# Patient Record
Sex: Male | Born: 1995 | Race: Black or African American | Hispanic: No | Marital: Single | Smoking: Current every day smoker
Health system: Southern US, Community
[De-identification: ages and names within clinical notes are randomized; demographics above are authoritative.]

## PROBLEM LIST (undated history)

## (undated) DIAGNOSIS — W3400XA Accidental discharge from unspecified firearms or gun, initial encounter: Secondary | ICD-10-CM

## (undated) HISTORY — PX: KNEE SURGERY: SHX244

---
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
## 2014-07-31 ENCOUNTER — Ambulatory Visit: Payer: Self-pay | Admitting: Orthopedic Surgery

## 2014-09-09 NOTE — Op Note (Signed)
PATIENT NAME:  Juan Donovan, Juan Donovan MR#:  [id number] DATE OF BIRTH:  28-Aug-1995  DATE OF PROCEDURE:  07/31/2014  PREOPERATIVE DIAGNOSIS: Right knee loose body.   POSTOPERATIVE DIAGNOSIS: Right knee loose body.     PROCEDURE: Removal of loose body, right knee.   ANESTHESIA: General.   SURGEON:  Leitha SchullerMichael J. Aleyna Cueva, MD  DESCRIPTION OF PROCEDURE: The patient was brought to the operating room and after adequate anesthesia was obtained, the right leg was prepped and draped in the usual sterile fashion with tourniquet applied to the upper thigh.  After patient identification and timeout procedures were completed, an inferolateral portal was made and the arthroscope was introduced and its initial inspection revealed normal-appearing patellofemoral joint coming around medially.   An inferomedial portal was made and an inspection of the medial meniscus was intact as was the articular cartilage.  ACL intact with some laxity to the anterior medial fibers.   There was some chondromalacia on the lateral femoral condyle. The lateral  meniscus was intact. The loose body could not be visualized on initial arthroscopy, and it was a very difficult loose body to remove.  It was in the lateral gutter and went posterior and it could not be brought back out. Attempts were made to bring it out using an additional portal for transpatellar tendon and another anterolateral portal lateral to the initial.   Finally with fluoroscopy being used to help get visualization, a posterolateral portal was made and the loose body was removed.   The tourniquet was raised during the case and was up for 120 minutes to try to minimize bleeding and get better visualization, but, again, it was very difficult to get the loose body out of the posterior space and ultimately had to have posterolateral incision. Tourniquet was let down prior to the close of the case and there was no significant bleeding. The wounds were closed with a 2-0 Vicryl subcutaneously  and 4-0 nylon for the skin. Thirty mL of 0.5% Sensorcaine infiltrated for postop analgesia. Sterile dressing of Xeroform, 4 x 4's, Webril and Ace wrap applied and the patient was sent to recovery in stable condition.   ESTIMATED BLOOD LOSS: Minimal.   COMPLICATIONS: None.   SPECIMEN: Removed loose body.     ____________________________ Leitha SchullerMichael J. Alexanderia Gorby, MD mjm:tr D: 07/31/2014 17:14:41 ET T: 07/31/2014 17:54:34 ET JOB#: [id number]  cc: Leitha SchullerMichael J. Edman Lipsey, MD, <Dictator> Leitha SchullerMICHAEL J Katrinna Travieso MD ELECTRONICALLY SIGNED 08/01/2014 3:21

## 2015-08-21 ENCOUNTER — Encounter: Payer: Self-pay | Admitting: Emergency Medicine

## 2015-08-21 ENCOUNTER — Emergency Department: Payer: Medicaid Other

## 2015-08-21 ENCOUNTER — Emergency Department
Admission: EM | Admit: 2015-08-21 | Discharge: 2015-08-21 | Disposition: A | Discharge status: Home / Self Care | Payer: Medicaid Other | Attending: Emergency Medicine | Admitting: Emergency Medicine

## 2015-08-21 DIAGNOSIS — Y9301 Activity, walking, marching and hiking: Secondary | ICD-10-CM | POA: Diagnosis not present

## 2015-08-21 DIAGNOSIS — S92131B Displaced fracture of posterior process of right talus, initial encounter for open fracture: Secondary | ICD-10-CM | POA: Insufficient documentation

## 2015-08-21 DIAGNOSIS — W3400XA Accidental discharge from unspecified firearms or gun, initial encounter: Secondary | ICD-10-CM | POA: Diagnosis not present

## 2015-08-21 DIAGNOSIS — M25571 Pain in right ankle and joints of right foot: Secondary | ICD-10-CM | POA: Diagnosis present

## 2015-08-21 DIAGNOSIS — F17200 Nicotine dependence, unspecified, uncomplicated: Secondary | ICD-10-CM | POA: Insufficient documentation

## 2015-08-21 DIAGNOSIS — Y999 Unspecified external cause status: Secondary | ICD-10-CM | POA: Insufficient documentation

## 2015-08-21 DIAGNOSIS — Y929 Unspecified place or not applicable: Secondary | ICD-10-CM | POA: Insufficient documentation

## 2015-08-21 DIAGNOSIS — S92101B Unspecified fracture of right talus, initial encounter for open fracture: Secondary | ICD-10-CM

## 2015-08-21 LAB — COMPREHENSIVE METABOLIC PANEL
ALK PHOS: 51 U/L (ref 38–126)
ALT: 13 U/L — AB (ref 17–63)
AST: 32 U/L (ref 15–41)
Albumin: 5.1 g/dL — ABNORMAL HIGH (ref 3.5–5.0)
Anion gap: 24 — ABNORMAL HIGH (ref 5–15)
BUN: 12 mg/dL (ref 6–20)
CHLORIDE: 103 mmol/L (ref 101–111)
CO2: 13 mmol/L — AB (ref 22–32)
Calcium: 9.4 mg/dL (ref 8.9–10.3)
Creatinine, Ser: 1.2 mg/dL (ref 0.61–1.24)
Glucose, Bld: 69 mg/dL (ref 65–99)
Potassium: 2.8 mmol/L — CL (ref 3.5–5.1)
SODIUM: 140 mmol/L (ref 135–145)
Total Bilirubin: 1.1 mg/dL (ref 0.3–1.2)
Total Protein: 8.1 g/dL (ref 6.5–8.1)

## 2015-08-21 LAB — CBC
HEMATOCRIT: 45.9 % (ref 40.0–52.0)
HEMOGLOBIN: 15.4 g/dL (ref 13.0–18.0)
MCH: 29.6 pg (ref 26.0–34.0)
MCHC: 33.7 g/dL (ref 32.0–36.0)
MCV: 87.9 fL (ref 80.0–100.0)
Platelets: 206 10*3/uL (ref 150–440)
RBC: 5.22 MIL/uL (ref 4.40–5.90)
RDW: 14.4 % (ref 11.5–14.5)
WBC: 10.9 10*3/uL — AB (ref 3.8–10.6)

## 2015-08-21 MED ORDER — CEPHALEXIN 500 MG PO CAPS: 500.0000 mg | ORAL_CAPSULE | Freq: Two times a day (BID) | ORAL | Status: AC

## 2015-08-21 MED ORDER — MORPHINE SULFATE (PF) 2 MG/ML IV SOLN
INTRAVENOUS | Status: AC
  Administered 2015-08-21: 4 mg via INTRAVENOUS
  Filled 2015-08-21: qty 2

## 2015-08-21 MED ORDER — POTASSIUM CHLORIDE CRYS ER 20 MEQ PO TBCR
40.0000 meq | EXTENDED_RELEASE_TABLET | Freq: Two times a day (BID) | ORAL | Status: DC
  Administered 2015-08-21: 40 meq via ORAL
  Filled 2015-08-21: qty 2

## 2015-08-21 MED ORDER — ONDANSETRON HCL 4 MG/2ML IJ SOLN
INTRAMUSCULAR | Status: AC
  Administered 2015-08-21: 4 mg via INTRAVENOUS
  Filled 2015-08-21: qty 2

## 2015-08-21 MED ORDER — IOPAMIDOL (ISOVUE-370) INJECTION 76%
125.0000 mL | Freq: Once | INTRAVENOUS | Status: AC | PRN
  Administered 2015-08-21: 125 mL via INTRAVENOUS

## 2015-08-21 MED ORDER — OXYCODONE-ACETAMINOPHEN 5-325 MG PO TABS: 1.0000 | ORAL_TABLET | ORAL | Status: DC | PRN

## 2015-08-21 MED ORDER — ONDANSETRON HCL 4 MG/2ML IJ SOLN
4.0000 mg | Freq: Once | INTRAMUSCULAR | Status: AC
  Administered 2015-08-21: 4 mg via INTRAVENOUS

## 2015-08-21 MED ORDER — BACITRACIN ZINC 500 UNIT/GM EX OINT
TOPICAL_OINTMENT | CUTANEOUS | Status: AC
  Filled 2015-08-21: qty 0.9

## 2015-08-21 MED ORDER — MORPHINE SULFATE (PF) 4 MG/ML IV SOLN: 4.0000 mg | Freq: Once | INTRAVENOUS | Status: DC
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
## 2015-08-21 NOTE — ED Notes (Signed)
Patient transported to CT 
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
## 2015-08-21 NOTE — ED Notes (Signed)
Pyxis was out of 4mg  Morphine tubes. Administered 2 (2mg ) doses. When charted, it scanned by the cabinet pull and would not allow to chart as duplicate.

## 2016-07-25 ENCOUNTER — Encounter: Payer: Self-pay | Admitting: Emergency Medicine

## 2016-07-25 DIAGNOSIS — Y929 Unspecified place or not applicable: Secondary | ICD-10-CM | POA: Insufficient documentation

## 2016-07-25 DIAGNOSIS — F17210 Nicotine dependence, cigarettes, uncomplicated: Secondary | ICD-10-CM | POA: Insufficient documentation

## 2016-07-25 DIAGNOSIS — Y999 Unspecified external cause status: Secondary | ICD-10-CM | POA: Insufficient documentation

## 2016-07-25 DIAGNOSIS — Y9389 Activity, other specified: Secondary | ICD-10-CM | POA: Insufficient documentation

## 2016-07-25 DIAGNOSIS — S0181XA Laceration without foreign body of other part of head, initial encounter: Secondary | ICD-10-CM | POA: Insufficient documentation
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
## 2016-07-26 ENCOUNTER — Emergency Department
Admission: EM | Admit: 2016-07-26 | Discharge: 2016-07-26 | Disposition: A | Discharge status: Home / Self Care | Payer: Medicaid Other | Attending: Student in an Organized Health Care Education/Training Program | Admitting: Student in an Organized Health Care Education/Training Program

## 2016-07-26 DIAGNOSIS — T07XXXA Unspecified multiple injuries, initial encounter: Secondary | ICD-10-CM

## 2016-07-26 HISTORY — DX: Accidental discharge from unspecified firearms or gun, initial encounter: W34.00XA

## 2016-07-26 MED ORDER — NAPROXEN 500 MG PO TABS: 500.0000 mg | ORAL_TABLET | Freq: Two times a day (BID) | ORAL | 0 refills | Status: AC

## 2016-07-26 MED ORDER — BUPIVACAINE HCL 0.5 % IJ SOLN
10.0000 mL | Freq: Once | INTRAMUSCULAR | Status: AC
  Administered 2016-07-26: 10 mL

## 2016-07-26 MED ORDER — TRAMADOL HCL 50 MG PO TABS
50.0000 mg | ORAL_TABLET | Freq: Four times a day (QID) | ORAL | 0 refills | Status: AC | PRN
Start: 2016-07-26 — End: 2017-07-26

## 2016-07-26 MED ORDER — LIDOCAINE-EPINEPHRINE-TETRACAINE (LET) SOLUTION
6.0000 mL | Freq: Once | NASAL | Status: AC
  Administered 2016-07-26: 6 mL via TOPICAL

## 2016-07-26 MED ORDER — LIDOCAINE-EPINEPHRINE-TETRACAINE (LET) SOLUTION
NASAL | Status: AC
  Administered 2016-07-26: 6 mL via TOPICAL
  Filled 2016-07-26: qty 6

## 2016-07-26 NOTE — ED Notes (Signed)
Pt's facial wounds irrigated with sterile saline and betadine, LET applied.

## 2016-07-26 NOTE — Discharge Instructions (Signed)
Tylenol or Motrin as needed for any discomfort. Take any/all prescribed medications as directed. Keep the cut clean & dry for the next 24 hours. After that you may wash it gently once a day with warm, soapy water only. NO peroxide or alcohol!! You may apply some antibiotic ointment & a Band-Aid afterwards. ° °
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
## 2016-07-26 NOTE — ED Notes (Signed)
Dr. Roxan Hockeyobinson at bed side to perform lac repair
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
## 2016-10-08 ENCOUNTER — Other Ambulatory Visit
Admission: AD | Admit: 2016-10-08 | Discharge: 2016-10-08 | Disposition: A | Discharge status: Home / Self Care | Attending: Family Medicine | Admitting: Family Medicine
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
## 2017-07-18 ENCOUNTER — Emergency Department
Admission: EM | Admit: 2017-07-18 | Discharge: 2017-07-18 | Disposition: A | Discharge status: Home / Self Care | Attending: Emergency Medicine | Admitting: Emergency Medicine

## 2017-07-18 ENCOUNTER — Other Ambulatory Visit: Payer: Self-pay

## 2017-07-18 DIAGNOSIS — Y999 Unspecified external cause status: Secondary | ICD-10-CM | POA: Insufficient documentation

## 2017-07-18 DIAGNOSIS — Y929 Unspecified place or not applicable: Secondary | ICD-10-CM | POA: Insufficient documentation

## 2017-07-18 DIAGNOSIS — Z5321 Procedure and treatment not carried out due to patient leaving prior to being seen by health care provider: Secondary | ICD-10-CM | POA: Insufficient documentation

## 2017-07-18 DIAGNOSIS — Y939 Activity, unspecified: Secondary | ICD-10-CM | POA: Insufficient documentation

## 2017-07-18 DIAGNOSIS — S0990XA Unspecified injury of head, initial encounter: Secondary | ICD-10-CM | POA: Insufficient documentation
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
## 2017-08-12 ENCOUNTER — Other Ambulatory Visit: Payer: Self-pay

## 2017-08-12 ENCOUNTER — Emergency Department
Admission: EM | Admit: 2017-08-12 | Discharge: 2017-08-12 | Disposition: A | Discharge status: Home / Self Care | Payer: No Typology Code available for payment source | Attending: Emergency Medicine | Admitting: Emergency Medicine

## 2017-08-12 ENCOUNTER — Emergency Department: Payer: No Typology Code available for payment source

## 2017-08-12 ENCOUNTER — Encounter: Payer: Self-pay | Admitting: Emergency Medicine

## 2017-08-12 DIAGNOSIS — Y939 Activity, unspecified: Secondary | ICD-10-CM | POA: Insufficient documentation

## 2017-08-12 DIAGNOSIS — F17210 Nicotine dependence, cigarettes, uncomplicated: Secondary | ICD-10-CM | POA: Insufficient documentation

## 2017-08-12 DIAGNOSIS — M25561 Pain in right knee: Secondary | ICD-10-CM | POA: Diagnosis not present

## 2017-08-12 DIAGNOSIS — Y999 Unspecified external cause status: Secondary | ICD-10-CM | POA: Insufficient documentation

## 2017-08-12 MED ORDER — CYCLOBENZAPRINE HCL 10 MG PO TABS: 10.0000 mg | ORAL_TABLET | Freq: Three times a day (TID) | ORAL | 0 refills | Status: DC | PRN

## 2017-08-12 MED ORDER — NAPROXEN 500 MG PO TABS: 500.0000 mg | ORAL_TABLET | Freq: Two times a day (BID) | ORAL | 0 refills | Status: AC
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
## 2017-08-12 NOTE — Discharge Instructions (Signed)
Follow-up with the primary care provider for symptoms that are not improving over the week. Return to the emergency department for symptoms of change or worsen if you are unable to schedule an appointment.
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
## 2017-11-12 ENCOUNTER — Telehealth: Payer: Self-pay | Admitting: Emergency Medicine

## 2017-11-12 ENCOUNTER — Other Ambulatory Visit
Admission: RE | Admit: 2017-11-12 | Discharge: 2017-11-12 | Disposition: A | Discharge status: Home / Self Care | Attending: Family Medicine | Admitting: Family Medicine
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
## 2018-02-26 ENCOUNTER — Emergency Department (HOSPITAL_COMMUNITY): Payer: Medicaid Other

## 2018-02-26 ENCOUNTER — Encounter (HOSPITAL_COMMUNITY)
Admission: EM | Disposition: A | Discharge status: Home / Self Care | Payer: Self-pay | Source: Home / Self Care | Attending: Emergency Medicine

## 2018-02-26 ENCOUNTER — Other Ambulatory Visit: Payer: Self-pay

## 2018-02-26 ENCOUNTER — Observation Stay (HOSPITAL_COMMUNITY)
Admission: EM | Admit: 2018-02-26 | Discharge: 2018-02-27 | Disposition: A | Discharge status: Home / Self Care | Payer: Medicaid Other | Attending: Orthopaedic Surgery | Admitting: Orthopaedic Surgery

## 2018-02-26 ENCOUNTER — Emergency Department (HOSPITAL_COMMUNITY): Payer: Medicaid Other | Admitting: Certified Registered"

## 2018-02-26 ENCOUNTER — Encounter (HOSPITAL_COMMUNITY): Payer: Self-pay

## 2018-02-26 DIAGNOSIS — Z23 Encounter for immunization: Secondary | ICD-10-CM | POA: Insufficient documentation

## 2018-02-26 DIAGNOSIS — S66321A Laceration of extensor muscle, fascia and tendon of left index finger at wrist and hand level, initial encounter: Secondary | ICD-10-CM | POA: Insufficient documentation

## 2018-02-26 DIAGNOSIS — Y249XXA Unspecified firearm discharge, undetermined intent, initial encounter: Secondary | ICD-10-CM

## 2018-02-26 DIAGNOSIS — W3400XA Accidental discharge from unspecified firearms or gun, initial encounter: Secondary | ICD-10-CM | POA: Diagnosis not present

## 2018-02-26 DIAGNOSIS — T79A12A Traumatic compartment syndrome of left upper extremity, initial encounter: Principal | ICD-10-CM | POA: Insufficient documentation

## 2018-02-26 DIAGNOSIS — S52392A Other fracture of shaft of radius, left arm, initial encounter for closed fracture: Secondary | ICD-10-CM

## 2018-02-26 DIAGNOSIS — S52302A Unspecified fracture of shaft of left radius, initial encounter for closed fracture: Secondary | ICD-10-CM | POA: Diagnosis present

## 2018-02-26 DIAGNOSIS — S41132A Puncture wound without foreign body of left upper arm, initial encounter: Secondary | ICD-10-CM

## 2018-02-26 DIAGNOSIS — F17210 Nicotine dependence, cigarettes, uncomplicated: Secondary | ICD-10-CM | POA: Insufficient documentation

## 2018-02-26 DIAGNOSIS — S51802A Unspecified open wound of left forearm, initial encounter: Secondary | ICD-10-CM

## 2018-02-26 HISTORY — PX: INCISION AND DRAINAGE WOUND WITH FOREIGN BODY REMOVAL: SHX5635

## 2018-02-26 HISTORY — PX: I & D EXTREMITY: SHX5045

## 2018-02-26 LAB — CBC WITH DIFFERENTIAL/PLATELET
Abs Immature Granulocytes: 0.06 10*3/uL (ref 0.00–0.07)
BASOS ABS: 0 10*3/uL (ref 0.0–0.1)
BASOS PCT: 0 %
EOS PCT: 1 %
Eosinophils Absolute: 0.2 10*3/uL (ref 0.0–0.5)
HCT: 41.4 % (ref 39.0–52.0)
Hemoglobin: 13.2 g/dL (ref 13.0–17.0)
IMMATURE GRANULOCYTES: 0 %
Lymphocytes Relative: 20 %
Lymphs Abs: 2.9 10*3/uL (ref 0.7–4.0)
MCH: 29.6 pg (ref 26.0–34.0)
MCHC: 31.9 g/dL (ref 30.0–36.0)
MCV: 92.8 fL (ref 80.0–100.0)
Monocytes Absolute: 0.6 10*3/uL (ref 0.1–1.0)
Monocytes Relative: 4 %
NEUTROS PCT: 75 %
NRBC: 0 % (ref 0.0–0.2)
Neutro Abs: 11.1 10*3/uL — ABNORMAL HIGH (ref 1.7–7.7)
PLATELETS: 173 10*3/uL (ref 150–400)
RBC: 4.46 MIL/uL (ref 4.22–5.81)
RDW: 13.4 % (ref 11.5–15.5)
WBC: 14.9 10*3/uL — AB (ref 4.0–10.5)

## 2018-02-26 LAB — BASIC METABOLIC PANEL
ANION GAP: 9 (ref 5–15)
BUN: 11 mg/dL (ref 6–20)
CALCIUM: 8.2 mg/dL — AB (ref 8.9–10.3)
CO2: 19 mmol/L — ABNORMAL LOW (ref 22–32)
CREATININE: 1.11 mg/dL (ref 0.61–1.24)
Chloride: 111 mmol/L (ref 98–111)
Glucose, Bld: 125 mg/dL — ABNORMAL HIGH (ref 70–99)
Potassium: 3.2 mmol/L — ABNORMAL LOW (ref 3.5–5.1)
SODIUM: 139 mmol/L (ref 135–145)

## 2018-02-26 SURGERY — IRRIGATION AND DEBRIDEMENT EXTREMITY
Anesthesia: General | Site: Arm Lower | Laterality: Left

## 2018-02-26 MED ORDER — LACTATED RINGERS IV SOLN
INTRAVENOUS | Status: DC | PRN
  Administered 2018-02-26 (×2): via INTRAVENOUS

## 2018-02-26 MED ORDER — SODIUM CHLORIDE 0.9 % IR SOLN
Status: DC | PRN
  Administered 2018-02-26: 1000 mL

## 2018-02-26 MED ORDER — HYDROCODONE-ACETAMINOPHEN 5-325 MG PO TABS: 1.0000 | ORAL_TABLET | ORAL | Status: DC | PRN

## 2018-02-26 MED ORDER — HYDROMORPHONE HCL 1 MG/ML IJ SOLN
INTRAMUSCULAR | Status: AC
  Administered 2018-02-26: 1 mg via INTRAVENOUS
  Filled 2018-02-26: qty 1

## 2018-02-26 MED ORDER — DEXAMETHASONE SODIUM PHOSPHATE 10 MG/ML IJ SOLN
INTRAMUSCULAR | Status: DC | PRN
  Administered 2018-02-26: 10 mg via INTRAVENOUS

## 2018-02-26 MED ORDER — DEXAMETHASONE SODIUM PHOSPHATE 10 MG/ML IJ SOLN
INTRAMUSCULAR | Status: AC
  Filled 2018-02-26: qty 1

## 2018-02-26 MED ORDER — CEFAZOLIN SODIUM-DEXTROSE 1-4 GM/50ML-% IV SOLN
1.0000 g | Freq: Four times a day (QID) | INTRAVENOUS | Status: AC
  Administered 2018-02-26 (×2): 1 g via INTRAVENOUS
  Filled 2018-02-26 (×4): qty 50

## 2018-02-26 MED ORDER — PHENYLEPHRINE 40 MCG/ML (10ML) SYRINGE FOR IV PUSH (FOR BLOOD PRESSURE SUPPORT)
PREFILLED_SYRINGE | INTRAVENOUS | Status: AC
  Filled 2018-02-26: qty 10

## 2018-02-26 MED ORDER — MIDAZOLAM HCL 2 MG/2ML IJ SOLN
INTRAMUSCULAR | Status: AC
  Filled 2018-02-26: qty 2

## 2018-02-26 MED ORDER — HYDROCODONE-ACETAMINOPHEN 7.5-325 MG PO TABS: 1.0000 | ORAL_TABLET | ORAL | Status: DC | PRN

## 2018-02-26 MED ORDER — LIDOCAINE 2% (20 MG/ML) 5 ML SYRINGE
INTRAMUSCULAR | Status: DC | PRN
  Administered 2018-02-26: 60 mg via INTRAVENOUS

## 2018-02-26 MED ORDER — FENTANYL CITRATE (PF) 250 MCG/5ML IJ SOLN
INTRAMUSCULAR | Status: AC
Start: 2018-02-26 — End: 2018-02-26
  Filled 2018-02-26: qty 5

## 2018-02-26 MED ORDER — TRAMADOL HCL 50 MG PO TABS
ORAL_TABLET | ORAL | Status: AC
  Filled 2018-02-26: qty 1

## 2018-02-26 MED ORDER — OXYCODONE HCL 5 MG/5ML PO SOLN: 5.0000 mg | Freq: Once | ORAL | Status: DC | PRN

## 2018-02-26 MED ORDER — ONDANSETRON HCL 4 MG/2ML IJ SOLN
INTRAMUSCULAR | Status: DC | PRN
  Administered 2018-02-26: 4 mg via INTRAVENOUS

## 2018-02-26 MED ORDER — POTASSIUM CHLORIDE IN NACL 20-0.9 MEQ/L-% IV SOLN
INTRAVENOUS | Status: DC
  Administered 2018-02-26: 08:00:00 via INTRAVENOUS
  Filled 2018-02-26: qty 1000

## 2018-02-26 MED ORDER — MORPHINE SULFATE (PF) 2 MG/ML IV SOLN: 0.5000 mg | INTRAVENOUS | Status: DC | PRN

## 2018-02-26 MED ORDER — CEFAZOLIN SODIUM-DEXTROSE 2-4 GM/100ML-% IV SOLN: 2.0000 g | Freq: Once | INTRAVENOUS | Status: DC

## 2018-02-26 MED ORDER — METOCLOPRAMIDE HCL 5 MG/ML IJ SOLN: 5.0000 mg | Freq: Three times a day (TID) | INTRAMUSCULAR | Status: DC | PRN

## 2018-02-26 MED ORDER — HYDROMORPHONE HCL 1 MG/ML IJ SOLN
1.0000 mg | Freq: Once | INTRAMUSCULAR | Status: AC
  Administered 2018-02-26: 1 mg via INTRAVENOUS

## 2018-02-26 MED ORDER — ACETAMINOPHEN 325 MG PO TABS: 325.0000 mg | ORAL_TABLET | Freq: Four times a day (QID) | ORAL | Status: DC | PRN

## 2018-02-26 MED ORDER — NICOTINE 21 MG/24HR TD PT24
21.0000 mg | MEDICATED_PATCH | Freq: Every day | TRANSDERMAL | Status: DC
  Administered 2018-02-26 – 2018-02-27 (×2): 21 mg via TRANSDERMAL
  Filled 2018-02-26 (×2): qty 1

## 2018-02-26 MED ORDER — HYDROMORPHONE HCL 1 MG/ML IJ SOLN
INTRAMUSCULAR | Status: AC
  Filled 2018-02-26: qty 1

## 2018-02-26 MED ORDER — TRAMADOL HCL 50 MG PO TABS
50.0000 mg | ORAL_TABLET | Freq: Four times a day (QID) | ORAL | Status: DC
  Administered 2018-02-26 – 2018-02-27 (×6): 50 mg via ORAL
  Filled 2018-02-26 (×5): qty 1

## 2018-02-26 MED ORDER — LIDOCAINE 2% (20 MG/ML) 5 ML SYRINGE
INTRAMUSCULAR | Status: AC
  Filled 2018-02-26: qty 5

## 2018-02-26 MED ORDER — CEFAZOLIN SODIUM 1 G IJ SOLR
INTRAMUSCULAR | Status: AC
  Filled 2018-02-26: qty 20

## 2018-02-26 MED ORDER — ONDANSETRON HCL 4 MG/2ML IJ SOLN
INTRAMUSCULAR | Status: AC
  Filled 2018-02-26: qty 2

## 2018-02-26 MED ORDER — PROPOFOL 10 MG/ML IV BOLUS
INTRAVENOUS | Status: DC | PRN
  Administered 2018-02-26: 200 mg via INTRAVENOUS

## 2018-02-26 MED ORDER — HYDROMORPHONE HCL 1 MG/ML IJ SOLN
1.0000 mg | Freq: Once | INTRAMUSCULAR | Status: DC
  Filled 2018-02-26: qty 1

## 2018-02-26 MED ORDER — ONDANSETRON HCL 4 MG/2ML IJ SOLN: 4.0000 mg | Freq: Four times a day (QID) | INTRAMUSCULAR | Status: DC | PRN

## 2018-02-26 MED ORDER — METOCLOPRAMIDE HCL 5 MG PO TABS: 5.0000 mg | ORAL_TABLET | Freq: Three times a day (TID) | ORAL | Status: DC | PRN

## 2018-02-26 MED ORDER — HYDROMORPHONE HCL 1 MG/ML IJ SOLN
0.2500 mg | INTRAMUSCULAR | Status: DC | PRN
  Administered 2018-02-26 (×2): 0.5 mg via INTRAVENOUS

## 2018-02-26 MED ORDER — PHENYLEPHRINE HCL 10 MG/ML IJ SOLN
INTRAMUSCULAR | Status: DC | PRN
  Administered 2018-02-26 (×2): 80 ug via INTRAVENOUS
  Administered 2018-02-26 (×2): 120 ug via INTRAVENOUS

## 2018-02-26 MED ORDER — DOCUSATE SODIUM 100 MG PO CAPS
100.0000 mg | ORAL_CAPSULE | Freq: Two times a day (BID) | ORAL | Status: DC
  Administered 2018-02-26 – 2018-02-27 (×2): 100 mg via ORAL
  Filled 2018-02-26 (×2): qty 1

## 2018-02-26 MED ORDER — ONDANSETRON HCL 4 MG PO TABS: 4.0000 mg | ORAL_TABLET | Freq: Four times a day (QID) | ORAL | Status: DC | PRN

## 2018-02-26 MED ORDER — CEFAZOLIN SODIUM-DEXTROSE 2-3 GM-%(50ML) IV SOLR
INTRAVENOUS | Status: DC | PRN
  Administered 2018-02-26: 2 g via INTRAVENOUS

## 2018-02-26 MED ORDER — HYDROMORPHONE HCL 1 MG/ML IJ SOLN
1.0000 mg | Freq: Once | INTRAMUSCULAR | Status: AC
  Administered 2018-02-26: 1 mg via INTRAVENOUS
  Filled 2018-02-26: qty 1

## 2018-02-26 MED ORDER — PROPOFOL 10 MG/ML IV BOLUS
INTRAVENOUS | Status: AC
  Filled 2018-02-26: qty 20

## 2018-02-26 MED ORDER — OXYCODONE HCL 5 MG PO TABS: 5.0000 mg | ORAL_TABLET | Freq: Once | ORAL | Status: DC | PRN

## 2018-02-26 MED ORDER — PROMETHAZINE HCL 25 MG/ML IJ SOLN: 6.2500 mg | INTRAMUSCULAR | Status: DC | PRN

## 2018-02-26 MED ORDER — SUCCINYLCHOLINE CHLORIDE 200 MG/10ML IV SOSY
PREFILLED_SYRINGE | INTRAVENOUS | Status: DC | PRN
  Administered 2018-02-26: 100 mg via INTRAVENOUS

## 2018-02-26 MED ORDER — NAPROXEN 250 MG PO TABS
250.0000 mg | ORAL_TABLET | Freq: Two times a day (BID) | ORAL | Status: DC
  Administered 2018-02-26 – 2018-02-27 (×3): 250 mg via ORAL
  Filled 2018-02-26 (×4): qty 1

## 2018-02-26 MED ORDER — SUCCINYLCHOLINE CHLORIDE 200 MG/10ML IV SOSY
PREFILLED_SYRINGE | INTRAVENOUS | Status: AC
  Filled 2018-02-26: qty 10

## 2018-02-26 MED ORDER — EPHEDRINE SULFATE 50 MG/ML IJ SOLN
INTRAMUSCULAR | Status: DC | PRN
  Administered 2018-02-26: 10 mg via INTRAVENOUS

## 2018-02-26 MED ORDER — INFLUENZA VAC SPLIT QUAD 0.5 ML IM SUSY
0.5000 mL | PREFILLED_SYRINGE | INTRAMUSCULAR | Status: AC | PRN
  Administered 2018-02-27: 0.5 mL via INTRAMUSCULAR
  Filled 2018-02-26: qty 0.5

## 2018-02-26 SURGICAL SUPPLY — 41 items
BANDAGE ACE 4X5 VEL STRL LF (GAUZE/BANDAGES/DRESSINGS) ×6 IMPLANT
BNDG ESMARK 4X9 LF (GAUZE/BANDAGES/DRESSINGS) ×3 IMPLANT
COVER SURGICAL LIGHT HANDLE (MISCELLANEOUS) ×3 IMPLANT
COVER WAND RF STERILE (DRAPES) ×3 IMPLANT
CUFF TOURNIQUET SINGLE 18IN (TOURNIQUET CUFF) ×3 IMPLANT
DRAPE SURG 17X23 STRL (DRAPES) ×3 IMPLANT
ELECT REM PT RETURN 9FT ADLT (ELECTROSURGICAL) ×3
ELECTRODE REM PT RTRN 9FT ADLT (ELECTROSURGICAL) ×1 IMPLANT
GAUZE SPONGE 4X4 12PLY STRL (GAUZE/BANDAGES/DRESSINGS) ×3 IMPLANT
GAUZE XEROFORM 1X8 LF (GAUZE/BANDAGES/DRESSINGS) ×3 IMPLANT
GLOVE BIOGEL PI IND STRL 8 (GLOVE) ×1 IMPLANT
GLOVE BIOGEL PI INDICATOR 8 (GLOVE) ×2
GLOVE ORTHO TXT STRL SZ7.5 (GLOVE) ×6 IMPLANT
GLOVE SURG SS PI 7.0 STRL IVOR (GLOVE) ×6 IMPLANT
GLOVE SURG SS PI 7.5 STRL IVOR (GLOVE) ×6 IMPLANT
GOWN STRL REUS W/ TWL LRG LVL3 (GOWN DISPOSABLE) ×1 IMPLANT
GOWN STRL REUS W/ TWL XL LVL3 (GOWN DISPOSABLE) ×1 IMPLANT
GOWN STRL REUS W/TWL LRG LVL3 (GOWN DISPOSABLE) ×2
GOWN STRL REUS W/TWL XL LVL3 (GOWN DISPOSABLE) ×2
HANDPIECE INTERPULSE COAX TIP (DISPOSABLE)
KIT BASIN OR (CUSTOM PROCEDURE TRAY) ×3 IMPLANT
KIT TURNOVER KIT B (KITS) ×3 IMPLANT
MANIFOLD NEPTUNE II (INSTRUMENTS) ×3 IMPLANT
NS IRRIG 1000ML POUR BTL (IV SOLUTION) ×3 IMPLANT
PACK ORTHO EXTREMITY (CUSTOM PROCEDURE TRAY) ×3 IMPLANT
PAD ARMBOARD 7.5X6 YLW CONV (MISCELLANEOUS) ×6 IMPLANT
PAD CAST 4YDX4 CTTN HI CHSV (CAST SUPPLIES) ×4 IMPLANT
PADDING CAST COTTON 4X4 STRL (CAST SUPPLIES) ×8
SET HNDPC FAN SPRY TIP SCT (DISPOSABLE) IMPLANT
SPONGE LAP 18X18 X RAY DECT (DISPOSABLE) ×3 IMPLANT
SPONGE LAP 4X18 RFD (DISPOSABLE) ×3 IMPLANT
STOCKINETTE IMPERVIOUS 9X36 MD (GAUZE/BANDAGES/DRESSINGS) ×3 IMPLANT
SUT ETHILON 2 0 FS 18 (SUTURE) ×6 IMPLANT
SUT ETHILON 4 0 PS 2 18 (SUTURE) ×12 IMPLANT
SWAB CULTURE ESWAB REG 1ML (MISCELLANEOUS) IMPLANT
TOWEL GREEN STERILE (TOWEL DISPOSABLE) ×3 IMPLANT
TUBE CONNECTING 12'X1/4 (SUCTIONS) ×1
TUBE CONNECTING 12X1/4 (SUCTIONS) ×2 IMPLANT
UNDERPAD 30X30 (UNDERPADS AND DIAPERS) ×3 IMPLANT
WATER STERILE IRR 1000ML POUR (IV SOLUTION) ×3 IMPLANT
YANKAUER SUCT BULB TIP NO VENT (SUCTIONS) ×3 IMPLANT
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
## 2018-02-26 NOTE — Plan of Care (Signed)

## 2018-02-26 NOTE — Anesthesia Procedure Notes (Signed)
Procedure Name: Intubation Date/Time: 02/26/2018 4:28 AM Performed by: Babs Bertin, CRNA Pre-anesthesia Checklist: Patient identified, Emergency Drugs available, Suction available and Patient being monitored Patient Re-evaluated:Patient Re-evaluated prior to induction Oxygen Delivery Method: Circle System Utilized Preoxygenation: Pre-oxygenation with 100% oxygen Induction Type: IV induction, Rapid sequence and Cricoid Pressure applied Laryngoscope Size: Mac and 3 Grade View: Grade I Tube type: Oral Tube size: 7.5 mm Number of attempts: 1 Airway Equipment and Method: Stylet and Oral airway Placement Confirmation: ETT inserted through vocal cords under direct vision,  positive ETCO2 and breath sounds checked- equal and bilateral Secured at: 22 cm Tube secured with: Tape Dental Injury: Teeth and Oropharynx as per pre-operative assessment
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
## 2018-02-26 NOTE — ED Notes (Signed)
Pt taken to OR by tech. Report given to CRNA via telephone. Pt SO took belongings, no valuables.

## 2018-02-26 NOTE — ED Notes (Signed)
EDP at bedside. Updating pt and family on POC. Ortho to come and see pt

## 2018-02-26 NOTE — Progress Notes (Signed)
Call to pharmacy to retime postop Ancef dose- given in OR at 0430
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
## 2018-02-26 NOTE — Anesthesia Preprocedure Evaluation (Signed)
Anesthesia Evaluation  Patient identified by MRN, date of birth, ID band Patient awake    Reviewed: Allergy & Precautions, NPO status , Patient's Chart, lab work & pertinent test results  Airway Mallampati: II  TM Distance: >3 FB Neck ROM: Full    Dental no notable dental hx.    Pulmonary Current Smoker,    Pulmonary exam normal breath sounds clear to auscultation       Cardiovascular negative cardio ROS Normal cardiovascular exam Rhythm:Regular Rate:Normal     Neuro/Psych negative neurological ROS  negative psych ROS   GI/Hepatic negative GI ROS, Neg liver ROS,   Endo/Other  negative endocrine ROS  Renal/GU negative Renal ROS  negative genitourinary   Musculoskeletal negative musculoskeletal ROS (+)   Abdominal   Peds negative pediatric ROS (+)  Hematology negative hematology ROS (+)   Anesthesia Other Findings   Reproductive/Obstetrics negative OB ROS                             Anesthesia Physical Anesthesia Plan  ASA: II and emergent  Anesthesia Plan: General   Post-op Pain Management:    Induction: Intravenous and Rapid sequence  PONV Risk Score and Plan: 2 and Ondansetron, Dexamethasone and Treatment may vary due to age or medical condition  Airway Management Planned: Oral ETT  Additional Equipment:   Intra-op Plan:   Post-operative Plan: Extubation in OR  Informed Consent: I have reviewed the patients History and Physical, chart, labs and discussed the procedure including the risks, benefits and alternatives for the proposed anesthesia with the patient or authorized representative who has indicated his/her understanding and acceptance.     Dental advisory given  Plan Discussed with: CRNA and Surgeon  Anesthesia Plan Comments:         Anesthesia Quick Evaluation  
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
## 2018-02-26 NOTE — Progress Notes (Signed)
Orthopedic Tech Progress Note Patient Details:  Juan Donovan 01-Dec-1995 [id number]  Ortho Devices Type of Ortho Device: Sling arm elevator Ortho Device/Splint Interventions: Application   Post Interventions Patient Tolerated: Well Instructions Provided: Care of device   Saul Fordyce 02/26/2018, 10:46 AM

## 2018-02-27 MED ORDER — HYDROCODONE-ACETAMINOPHEN 7.5-325 MG PO TABS: 1.0000 | ORAL_TABLET | ORAL | 0 refills | Status: AC | PRN

## 2018-02-27 NOTE — Plan of Care (Signed)
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
## 2018-02-27 NOTE — Progress Notes (Signed)
Provided discharge education/instructions, all questions and concerns addressed, per note, OT recommended 3-in-1 BSC but Pt refused and stated he does not need it. Discharged home with belongings accompanied by girlfriend.

## 2018-02-27 NOTE — Discharge Instructions (Signed)
Leave dressing intact. Keep splint dry. See Dr. Ophelia Charter in 4 days, call for appt in his office [phone].  Make sure you keep arm elevated above heart to decrease swelling and pain. Continue to work on finger straightening and bending.
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
## 2018-02-28 ENCOUNTER — Encounter (HOSPITAL_COMMUNITY): Payer: Self-pay | Admitting: Orthopaedic Surgery

## 2018-03-02 ENCOUNTER — Ambulatory Visit (INDEPENDENT_AMBULATORY_CARE_PROVIDER_SITE_OTHER): Payer: Self-pay | Admitting: Orthopaedic Surgery
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
## 2018-06-03 ENCOUNTER — Emergency Department
Admission: EM | Admit: 2018-06-03 | Discharge: 2018-06-03 | Disposition: A | Discharge status: Home / Self Care | Payer: Medicaid Other | Attending: Emergency Medicine | Admitting: Emergency Medicine

## 2018-06-03 ENCOUNTER — Emergency Department: Payer: Medicaid Other

## 2018-06-03 ENCOUNTER — Other Ambulatory Visit: Payer: Self-pay

## 2018-06-03 ENCOUNTER — Encounter: Payer: Self-pay | Admitting: Emergency Medicine

## 2018-06-03 DIAGNOSIS — M25522 Pain in left elbow: Secondary | ICD-10-CM | POA: Insufficient documentation

## 2018-06-03 DIAGNOSIS — F17210 Nicotine dependence, cigarettes, uncomplicated: Secondary | ICD-10-CM | POA: Diagnosis not present

## 2018-06-03 DIAGNOSIS — G8929 Other chronic pain: Secondary | ICD-10-CM | POA: Diagnosis not present

## 2018-06-03 DIAGNOSIS — W320XXS Accidental handgun discharge, sequela: Secondary | ICD-10-CM | POA: Insufficient documentation

## 2018-06-03 DIAGNOSIS — M795 Residual foreign body in soft tissue: Secondary | ICD-10-CM | POA: Diagnosis not present

## 2018-06-03 DIAGNOSIS — S61542S Puncture wound with foreign body of left wrist, sequela: Secondary | ICD-10-CM | POA: Diagnosis not present

## 2018-06-03 MED ORDER — OXYCODONE-ACETAMINOPHEN 5-325 MG PO TABS
1.0000 | ORAL_TABLET | Freq: Once | ORAL | Status: AC
  Administered 2018-06-03: 1 via ORAL
  Filled 2018-06-03: qty 1

## 2018-06-03 MED ORDER — IBUPROFEN 600 MG PO TABS
600.0000 mg | ORAL_TABLET | Freq: Once | ORAL | Status: AC
  Administered 2018-06-03: 600 mg via ORAL
  Filled 2018-06-03: qty 1

## 2018-06-03 MED ORDER — TRAMADOL HCL 50 MG PO TABS: 50.0000 mg | ORAL_TABLET | Freq: Four times a day (QID) | ORAL | 0 refills | Status: AC | PRN

## 2018-06-03 MED ORDER — IBUPROFEN 600 MG PO TABS: 600.0000 mg | ORAL_TABLET | Freq: Three times a day (TID) | ORAL | 0 refills | Status: AC | PRN

## 2018-06-03 NOTE — Discharge Instructions (Addendum)
Follow-up orthopedic for definitive evaluation and treatment. 
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
## 2018-12-01 ENCOUNTER — Other Ambulatory Visit
Admit: 2018-12-01 | Discharge: 2018-12-01 | Disposition: A | Discharge status: Home / Self Care | Attending: Family Medicine | Admitting: Family Medicine
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
## 2019-09-03 ENCOUNTER — Other Ambulatory Visit: Payer: Self-pay

## 2019-09-03 ENCOUNTER — Encounter: Payer: Self-pay | Admitting: Emergency Medicine

## 2019-09-03 ENCOUNTER — Emergency Department
Admission: EM | Admit: 2019-09-03 | Discharge: 2019-09-04 | Disposition: A | Discharge status: Home / Self Care | Payer: Medicaid Other | Attending: Emergency Medicine | Admitting: Emergency Medicine

## 2019-09-03 DIAGNOSIS — Y288XXA Contact with other sharp object, undetermined intent, initial encounter: Secondary | ICD-10-CM | POA: Diagnosis not present

## 2019-09-03 DIAGNOSIS — Z23 Encounter for immunization: Secondary | ICD-10-CM | POA: Insufficient documentation

## 2019-09-03 DIAGNOSIS — Y939 Activity, unspecified: Secondary | ICD-10-CM | POA: Insufficient documentation

## 2019-09-03 DIAGNOSIS — Y929 Unspecified place or not applicable: Secondary | ICD-10-CM | POA: Diagnosis not present

## 2019-09-03 DIAGNOSIS — F1210 Cannabis abuse, uncomplicated: Secondary | ICD-10-CM | POA: Insufficient documentation

## 2019-09-03 DIAGNOSIS — F17210 Nicotine dependence, cigarettes, uncomplicated: Secondary | ICD-10-CM | POA: Diagnosis not present

## 2019-09-03 DIAGNOSIS — S61412A Laceration without foreign body of left hand, initial encounter: Secondary | ICD-10-CM

## 2019-09-03 DIAGNOSIS — Y999 Unspecified external cause status: Secondary | ICD-10-CM | POA: Insufficient documentation

## 2019-09-03 MED ORDER — TETANUS-DIPHTH-ACELL PERTUSSIS 5-2.5-18.5 LF-MCG/0.5 IM SUSP
0.5000 mL | Freq: Once | INTRAMUSCULAR | Status: AC
  Administered 2019-09-04: 0.5 mL via INTRAMUSCULAR
  Filled 2019-09-03: qty 0.5

## 2019-09-03 MED ORDER — IBUPROFEN 600 MG PO TABS
600.0000 mg | ORAL_TABLET | Freq: Once | ORAL | Status: AC
  Administered 2019-09-04: 600 mg via ORAL
  Filled 2019-09-03: qty 1
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
## 2019-09-03 NOTE — Discharge Instructions (Addendum)
Keep laceration dry and clean. Wash with warm water and soap. Apply topical bacitracin.  Watch for signs of infection: pus, redness of the skin surrounding it, or fever. If these develop see your doctor or return to the ER for antibiotics.  
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
## 2019-09-04 NOTE — ED Notes (Signed)
Pt left with [place] in custody  No peripheral IV placed this visit.   Discharge instructions reviewed with patient. Questions fielded by this RN. Patient verbalizes understanding of instructions. Patient discharged home in stable condition per veronese. No acute distress noted at time of discharge.
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
## 2023-07-12 ENCOUNTER — Ambulatory Visit: Admitting: Nurse Practitioner

## 2023-07-12 ENCOUNTER — Encounter: Payer: Self-pay | Admitting: Nurse Practitioner

## 2023-07-12 DIAGNOSIS — Z202 Contact with and (suspected) exposure to infections with a predominantly sexual mode of transmission: Secondary | ICD-10-CM

## 2023-07-12 DIAGNOSIS — Z113 Encounter for screening for infections with a predominantly sexual mode of transmission: Secondary | ICD-10-CM

## 2023-07-12 NOTE — Progress Notes (Unsigned)
 Pt is here for STD screening and contact to chlamydia. The patient was dispensed Doxycycline 100 mg 2x/day for 7 days today. I provided counseling today regarding the medication. We discussed the medication, the side effects and when to call clinic. Patient given the opportunity to ask questions for any clarification. Condoms and brochure given. Sonda Primes, RN.

## 2023-07-13 ENCOUNTER — Encounter: Payer: Self-pay | Admitting: Nurse Practitioner

## 2023-07-13 MED ORDER — DOXYCYCLINE HYCLATE 100 MG PO TABS: 100.0000 mg | ORAL_TABLET | Freq: Two times a day (BID) | ORAL | Status: AC

## 2023-07-13 NOTE — Progress Notes (Signed)
 [facility] Department STI clinic [address] [place] [address] Main phone: [phone]  STI screening visit  Subjective:  Juan Donovan is a 28 y.o. male being seen today for an STI screening visit. The patient reports they do not have symptoms.    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 male who presents to the clinic for asymptomatic STI testing and treatment as a contact to positive chlamydia. He reports 1 male partner, practices penile/vaginal penetrative and oral sex, does not use condoms, and has no history of STI. Reports last sexual encounter was 1 day ago.    STI screening history: Last HIV test per patient/review of record was No results found for: "HMHIVSCREEN" No results found for: "HIV"  Last HEPC test per patient/review of record was No results found for: "HMHEPCSCREEN" No components found for: "HEPC"   Last HEPB test per patient/review of record was No components found for: "HMHEPBSCREEN"   Fertility: Does the patient or their partner desires a pregnancy in the next year? No  Screening for MPX risk: Does the patient have an unexplained rash? No Is the patient MSM? No Does the patient endorse multiple sex partners or anonymous sex partners? No Did the patient have close or sexual contact with a person diagnosed with MPX? No Has the patient traveled outside the [place] where MPX is endemic? No Is there a high clinical suspicion for MPX-- evidenced by one of the following No  -Unlikely to be chickenpox  -Lymphadenopathy  -Rash that present in same phase of evolution on any given body part   See flowsheet for further details and programmatic requirements.    Immunization History  Administered Date(s) Administered   Influenza,inj,Quad PF,6+ Mos 02/27/2018   Tdap 09/04/2019     The following portions of the patient's history were reviewed and updated as appropriate: allergies, current medications, past medical history, past social history, past surgical history and problem list.  Objective:  There were no vitals filed for this visit.  Physical Exam Nursing note reviewed.  Constitutional:      Appearance: Normal appearance.  HENT:     Head: Normocephalic.     Salivary Glands: Right salivary gland is not diffusely enlarged or tender. Left salivary gland is not diffusely enlarged or tender.     Mouth/Throat:     Lips: Pink. No lesions.     Mouth: Mucous membranes are moist. No oral lesions.     Tongue: No lesions. Tongue does not deviate from midline.     Pharynx: Oropharynx is clear. Uvula midline. No oropharyngeal exudate or posterior oropharyngeal erythema.     Tonsils: No tonsillar exudate.  Eyes:     General:        Right eye: No discharge.        Left eye: No discharge.     Conjunctiva/sclera:     Right eye: Right conjunctiva is not injected. No exudate.    Left eye: Left conjunctiva is not injected. No exudate. Pulmonary:     Effort: Pulmonary effort is normal.  Genitourinary:    Comments: Patient asymptomatic and declines genital exam.  Lymphadenopathy:     Head:  Right side of head: No submental, submandibular, tonsillar, preauricular or posterior auricular adenopathy.     Left side of head: No submental, submandibular, tonsillar, preauricular or posterior auricular adenopathy.     Cervical: No cervical adenopathy.     Right cervical: No superficial or posterior cervical adenopathy.    Left cervical: No superficial or posterior cervical adenopathy.     Upper Body:     Right upper body: No supraclavicular or axillary adenopathy.     Left upper body: No supraclavicular or axillary adenopathy.  Skin:    General: Skin  is warm and dry.     Findings: No lesion or rash.     Comments: Skin tone appropriate for ethnicity. Assessed exposed areas and back only.   Neurological:     Mental Status: He is alert and oriented to person, place, and time.  Psychiatric:        Attention and Perception: Attention and perception normal.        Mood and Affect: Mood and affect normal.        Speech: Speech normal.        Behavior: Behavior normal. Behavior is cooperative.        Thought Content: Thought content normal.     Assessment and Plan:  FAHIM KATS is a 28 y.o. male presenting to the [facility] Department for STI screening  1. Screening for venereal disease (Primary) Patient declined blood work for STI testing today.  - Chlamydia/GC NAA, Confirmation - Gonococcus culture  2. Chlamydia contact  - doxycycline (VIBRA-TABS) 100 MG tablet; Take 1 tablet (100 mg total) by mouth 2 (two) times daily for 7 days.  Patient does not have STI symptoms Patient accepted screenings including urine GC/Chlamydia and oral gonococcus culture.  Patient meets criteria for HepB screening? Yes. Ordered? No-Patient declined Patient meets criteria for HepC screening? Yes. Ordered? No-Patient declined Recommended condom use with all sex Discussed importance of condom use for STI prevention  Treat positive test results per standing order. Discussed time line for State Lab results and that patient will be called with positive results and encouraged patient to call if he had not heard in 2 weeks Recommended repeat testing in 3 months with positive results. Recommended returning for continued or worsening symptoms.   Return if symptoms worsen or fail to improve.  No future appointments.  Total time with patient 15 minutes.   Edmonia James, NP

## 2023-07-17 LAB — CHLAMYDIA/GC NAA, CONFIRMATION
Chlamydia trachomatis, NAA: POSITIVE — AB
Neisseria gonorrhoeae, NAA: NEGATIVE

## 2023-07-17 LAB — GONOCOCCUS CULTURE

## 2023-07-17 LAB — C. TRACHOMATIS NAA, CONFIRM: C. trachomatis NAA, Confirm: POSITIVE — AB

## 2023-07-21 ENCOUNTER — Telehealth: Payer: Self-pay

## 2023-07-21 NOTE — Telephone Encounter (Signed)
 VM is not set up.  Berdie Ogren, RN

## 2023-07-21 NOTE — Telephone Encounter (Signed)
-----   Message from Nurse Nadean Corwin sent at 07/19/2023  4:29 PM EDT -----  ----- Message ----- From: William Hamburger, RN Sent: 07/19/2023   8:24 AM EDT To: Stasia Cavalier, RN   ----- Message ----- From: Interface, [facility] Lab Results In Sent: 07/16/2023  11:35 AM EDT To: Achd-Results

## 2023-07-22 ENCOUNTER — Telehealth: Payer: Self-pay

## 2023-07-22 NOTE — Telephone Encounter (Signed)
-----   Message from Nurse Nadean Corwin sent at 07/19/2023  4:29 PM EDT -----  ----- Message ----- From: William Hamburger, RN Sent: 07/19/2023   8:24 AM EDT To: Stasia Cavalier, RN   ----- Message ----- From: Interface, [facility] Lab Results In Sent: 07/16/2023  11:35 AM EDT To: Achd-Results

## 2023-07-22 NOTE — Telephone Encounter (Signed)
 VM is not set up.  Berdie Ogren, RN

## 2023-08-16 NOTE — Addendum Note (Signed)
 Addended by: Heywood Bene on: 08/16/2023 11:52 AM   Modules accepted: Orders
